# Patient Record
Sex: Female | Born: 1935 | Race: White | Hispanic: No | Smoking: Former smoker
Health system: Southern US, Community
[De-identification: ages and names within clinical notes are randomized; demographics above are authoritative.]

## PROBLEM LIST (undated history)

## (undated) DIAGNOSIS — F0390 Unspecified dementia without behavioral disturbance: Secondary | ICD-10-CM

## (undated) DIAGNOSIS — I10 Essential (primary) hypertension: Secondary | ICD-10-CM

## (undated) DIAGNOSIS — K219 Gastro-esophageal reflux disease without esophagitis: Secondary | ICD-10-CM

## (undated) HISTORY — PX: TOTAL HIP ARTHROPLASTY: SHX124

---

## 1998-09-09 ENCOUNTER — Ambulatory Visit (HOSPITAL_COMMUNITY): Admission: RE | Admit: 1998-09-09 | Discharge: 1998-09-09 | Payer: Self-pay | Admitting: Neurosurgery

## 1998-09-09 ENCOUNTER — Encounter: Payer: Self-pay | Admitting: Neurosurgery

## 1999-09-06 ENCOUNTER — Ambulatory Visit (HOSPITAL_COMMUNITY): Admission: RE | Admit: 1999-09-06 | Discharge: 1999-09-06 | Payer: Self-pay | Admitting: Neurosurgery

## 1999-09-06 ENCOUNTER — Encounter: Payer: Self-pay | Admitting: Neurosurgery

## 2006-07-15 ENCOUNTER — Ambulatory Visit: Payer: Self-pay | Admitting: Otolaryngology

## 2007-04-01 ENCOUNTER — Ambulatory Visit: Payer: Self-pay | Admitting: Gastroenterology

## 2007-05-30 ENCOUNTER — Ambulatory Visit: Payer: Self-pay | Admitting: Gastroenterology

## 2007-07-01 ENCOUNTER — Ambulatory Visit: Payer: Self-pay | Admitting: Internal Medicine

## 2008-12-30 ENCOUNTER — Ambulatory Visit: Payer: Self-pay | Admitting: Internal Medicine

## 2010-02-20 ENCOUNTER — Ambulatory Visit: Payer: Self-pay | Admitting: Internal Medicine

## 2012-01-29 ENCOUNTER — Ambulatory Visit: Payer: Self-pay | Admitting: Internal Medicine

## 2012-09-25 ENCOUNTER — Ambulatory Visit: Payer: Self-pay | Admitting: Family

## 2013-03-23 ENCOUNTER — Ambulatory Visit: Payer: Self-pay | Admitting: Internal Medicine

## 2014-03-19 ENCOUNTER — Ambulatory Visit: Payer: Self-pay | Admitting: Internal Medicine

## 2014-08-24 ENCOUNTER — Ambulatory Visit: Payer: Self-pay | Admitting: Internal Medicine

## 2014-11-30 ENCOUNTER — Ambulatory Visit: Payer: Self-pay | Admitting: Internal Medicine

## 2015-01-13 IMAGING — RF DG BARIUM SWALLOW (MODIFIED)
1 series · 1 of 1 positions shown · non-contrast
Comparison: Barium swallow examination August 24, 2014

ADDENDUM:
Please note that most of the fluoroscopic images were not
successfully saved and PACs. These should be available on the
videotape.
CLINICAL DATA: Suspected silent laryngeal penetration of the barium
on a standard barium swallow examination August 24, 2014.

EXAM:
MODIFIED BARIUM SWALLOW
TECHNIQUE: Different consistencies of barium were administered orally to the
patient by the Speech Pathologist. Imaging of the pharynx was
performed in the lateral projection.
FLUOROSCOPY TIME:  1 min, 36 seconds.

[Series 1: run · 1 of 1 slices shown]
[im 1/1]
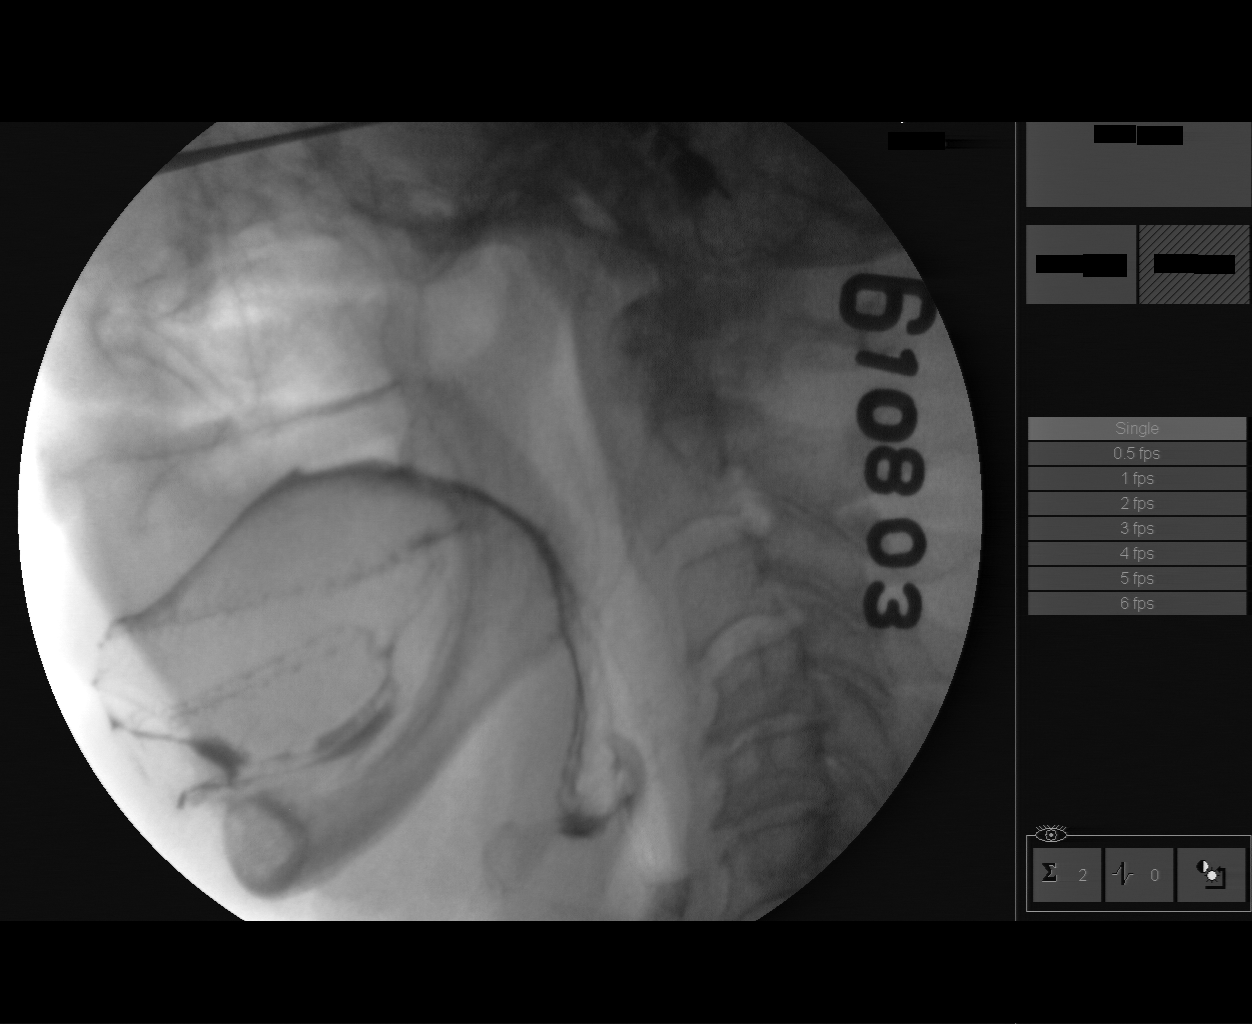

[1 of 1 positions shown; findings below may reference images not displayed]

FINDINGS: Thin liquid- within normal limits

Nectar thick liquid- within normal limits

Irwn?Dante within normal limits

Irwn?Washburn with cracker- within normal limits
IMPRESSION: No laryngeal penetration of the barium was observed on today's
study.

Please refer to the Speech Pathologists report for complete details
and recommendations.

## 2015-06-14 ENCOUNTER — Ambulatory Visit
Admission: RE | Admit: 2015-06-14 | Discharge: 2015-06-14 | Disposition: A | Discharge status: Home / Self Care | Payer: Medicare Other | Source: Ambulatory Visit | Attending: Internal Medicine | Admitting: Internal Medicine

## 2015-06-14 ENCOUNTER — Other Ambulatory Visit: Payer: Self-pay | Admitting: Internal Medicine

## 2015-06-14 DIAGNOSIS — M25472 Effusion, left ankle: Secondary | ICD-10-CM | POA: Insufficient documentation

## 2015-06-14 DIAGNOSIS — M25471 Effusion, right ankle: Secondary | ICD-10-CM | POA: Insufficient documentation

## 2015-06-14 DIAGNOSIS — X58XXXA Exposure to other specified factors, initial encounter: Secondary | ICD-10-CM | POA: Insufficient documentation

## 2015-06-14 DIAGNOSIS — S82301A Unspecified fracture of lower end of right tibia, initial encounter for closed fracture: Secondary | ICD-10-CM | POA: Diagnosis not present

## 2015-06-14 DIAGNOSIS — R609 Edema, unspecified: Secondary | ICD-10-CM

## 2015-06-18 ENCOUNTER — Ambulatory Visit
Admission: RE | Admit: 2015-06-18 | Discharge: 2015-06-18 | Disposition: A | Discharge status: Home / Self Care | Payer: Medicare Other | Source: Ambulatory Visit | Attending: Family Medicine | Admitting: Family Medicine

## 2015-06-18 ENCOUNTER — Other Ambulatory Visit: Payer: Self-pay | Admitting: Family Medicine

## 2015-06-18 ENCOUNTER — Other Ambulatory Visit
Admission: RE | Admit: 2015-06-18 | Discharge: 2015-06-18 | Disposition: A | Discharge status: Home / Self Care | Payer: Medicare Other | Source: Ambulatory Visit | Attending: *Deleted | Admitting: *Deleted

## 2015-06-18 DIAGNOSIS — M79605 Pain in left leg: Secondary | ICD-10-CM

## 2015-06-18 DIAGNOSIS — M25472 Effusion, left ankle: Secondary | ICD-10-CM

## 2015-06-18 DIAGNOSIS — R600 Localized edema: Secondary | ICD-10-CM | POA: Diagnosis present

## 2015-06-18 LAB — FIBRIN DERIVATIVES D-DIMER (ARMC ONLY): Fibrin derivatives D-dimer (ARMC): 1727 — ABNORMAL HIGH (ref 0–499)

## 2015-07-03 IMAGING — US US EXTREM LOW VENOUS*L*
1 series · 13 of 24 positions shown · non-contrast
Comparison: None.

CLINICAL DATA: [AGE] with 2 week history of left lower
extremity pain and left ankle edema.



[Series 1: us extrem low venous*left* · 0.05mm/px · 13 of 39 slices shown]
[im 1/39]
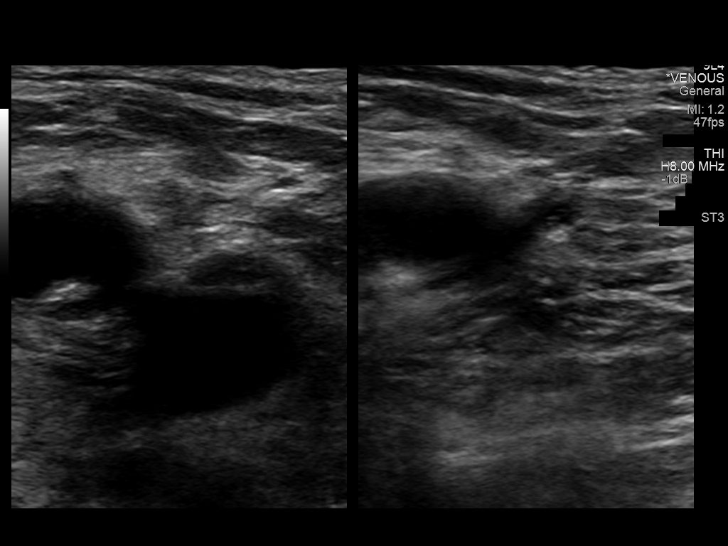
[im 4/39]
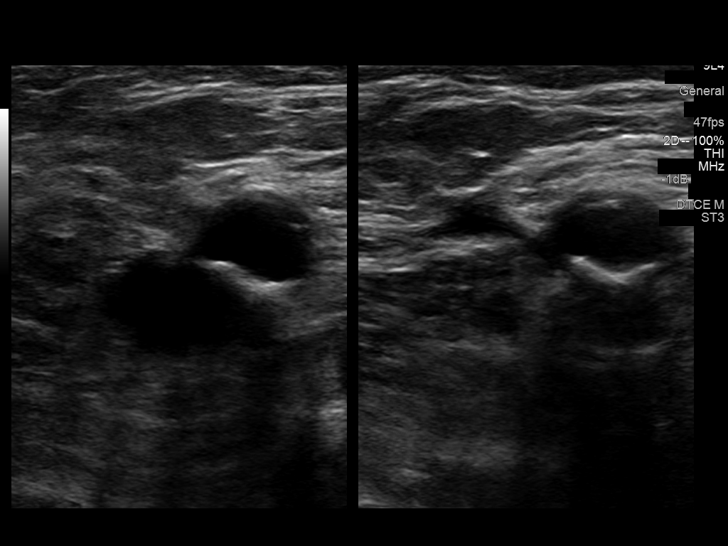
[im 7/39]
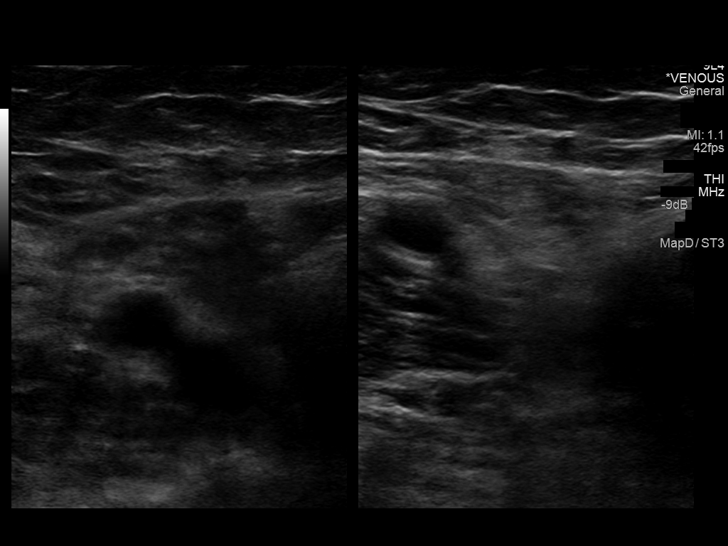
[im 10/39]
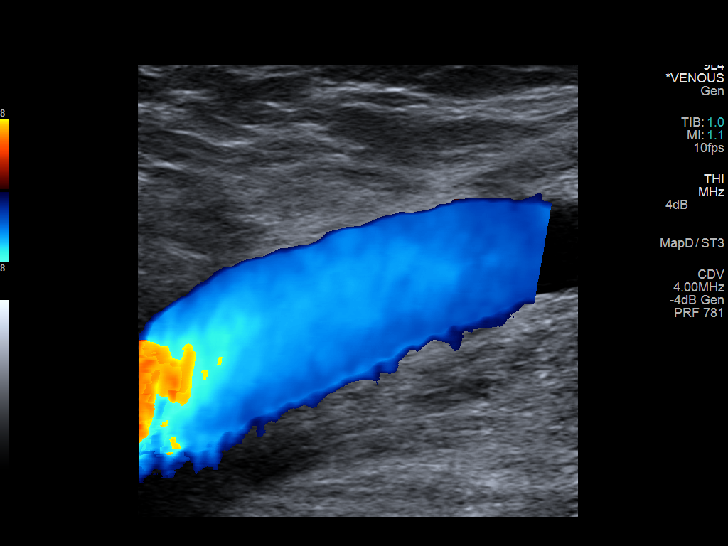
[im 14/39]
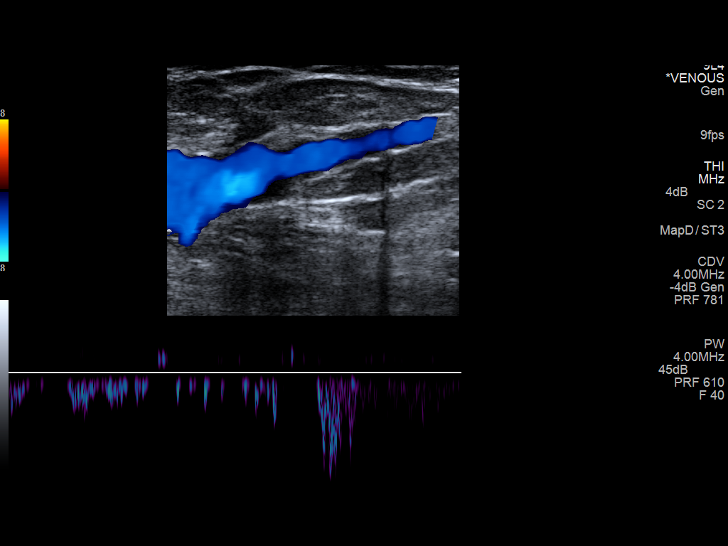
[im 17/39]
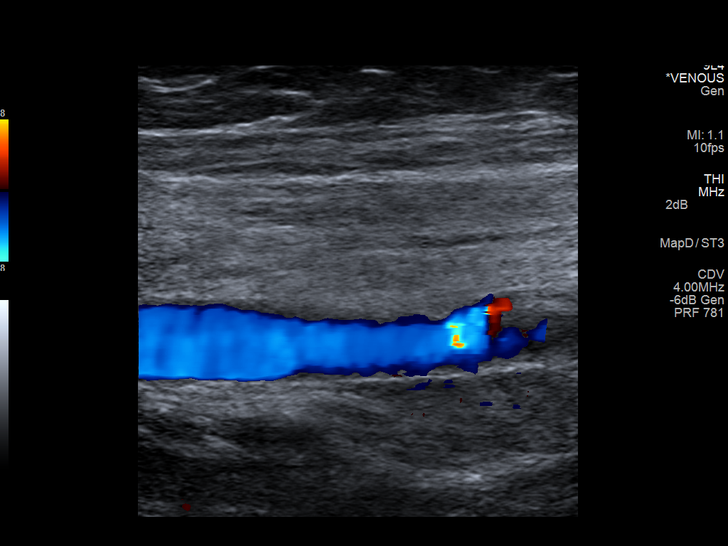
[im 20/39]
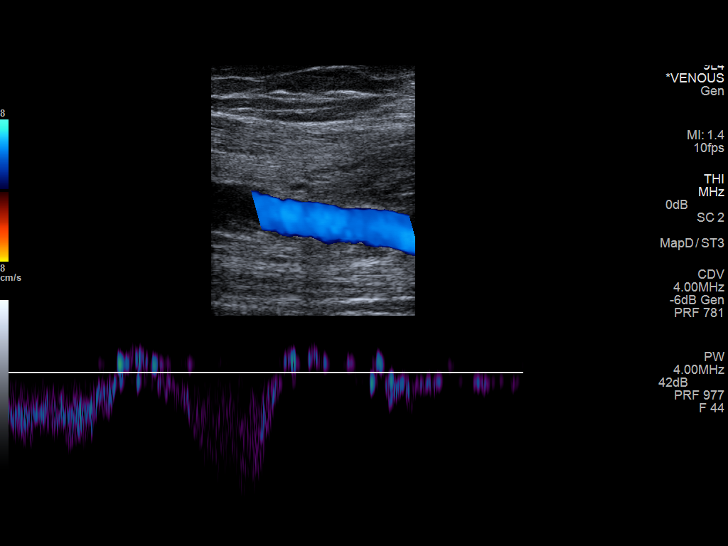
[im 22/39]
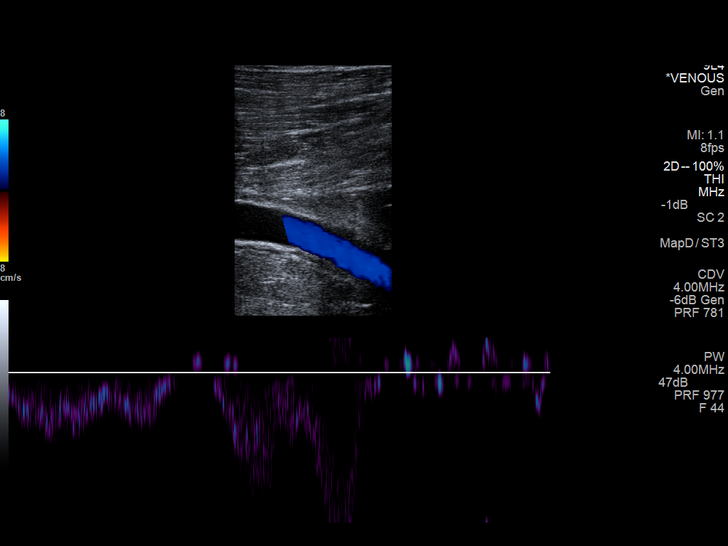
[im 25/39]
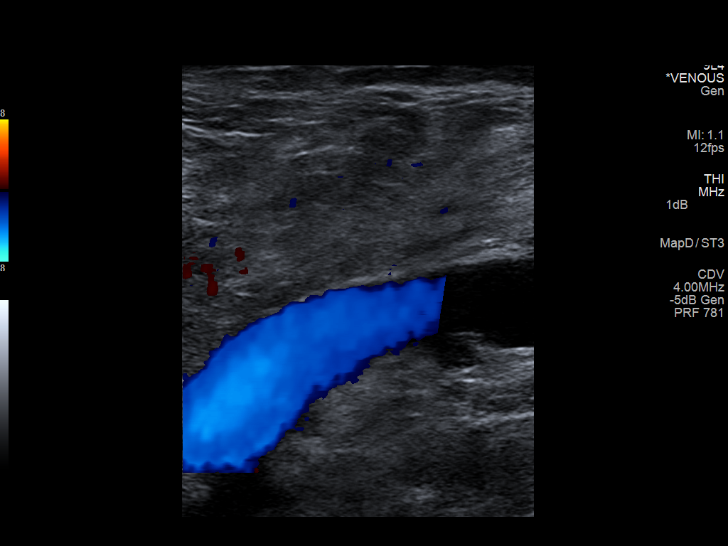
[im 29/39]
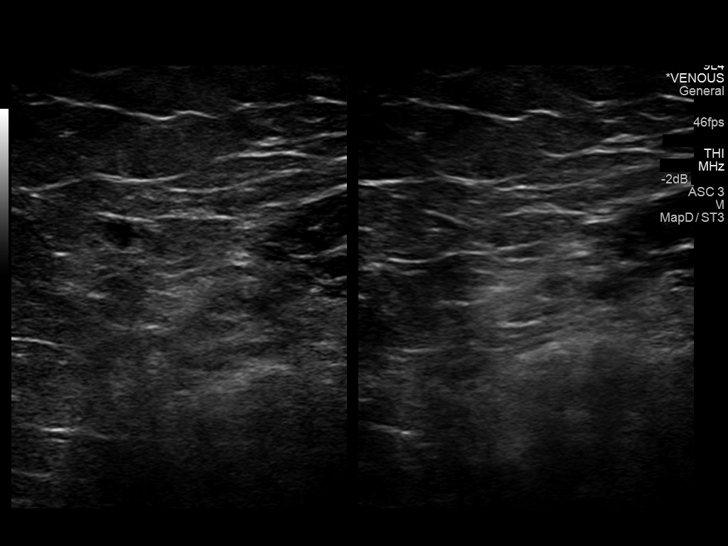
[im 32/39]
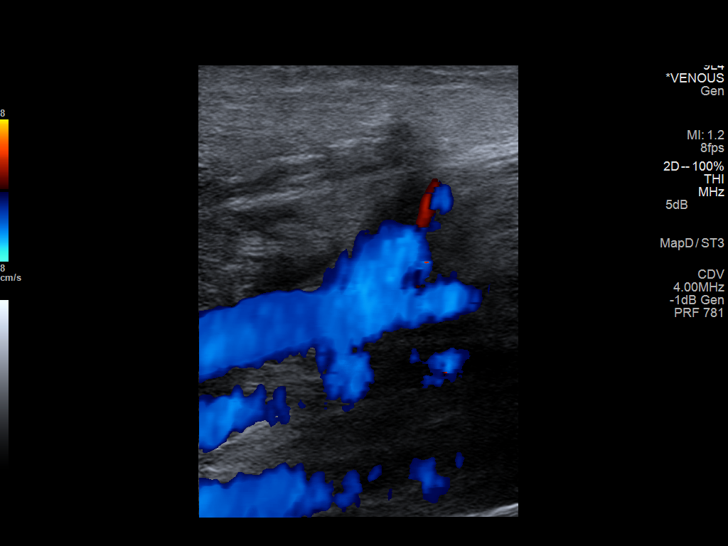
[im 35/39]
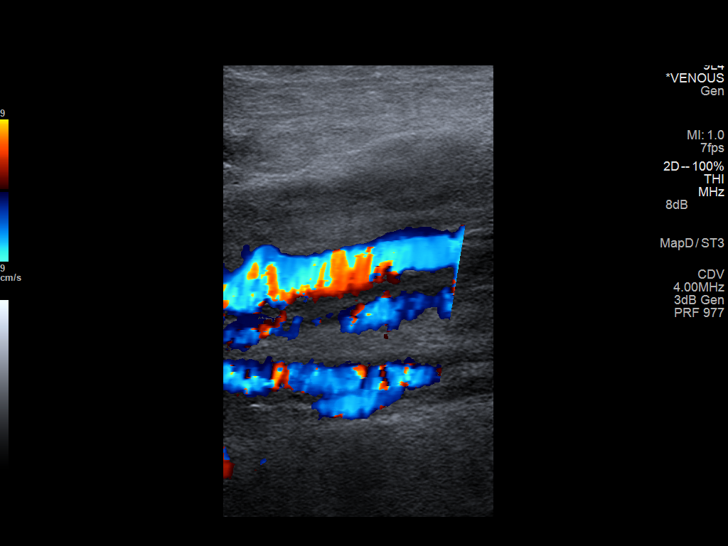
[im 39/39]
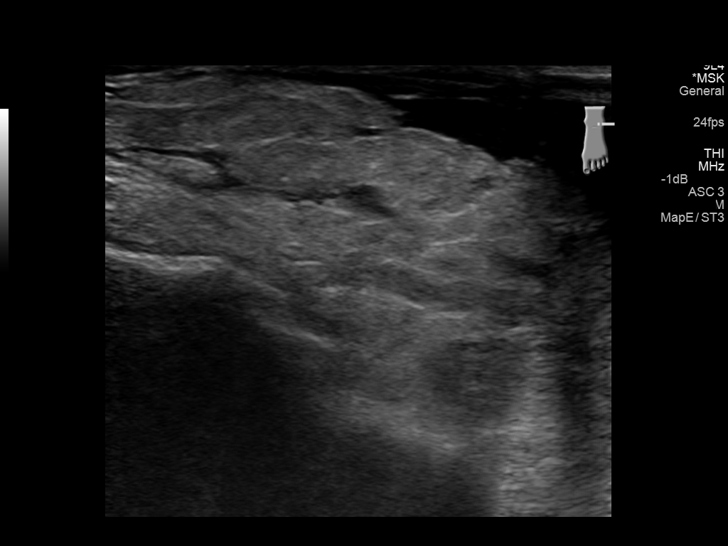

[13 of 24 positions shown; findings below may reference images not displayed]

FINDINGS: Contralateral Common Femoral Vein: Respiratory phasicity is normal
and symmetric with the symptomatic side. No evidence of thrombus.
Normal compressibility.

Common Femoral Vein: No evidence of thrombus. Normal
compressibility, respiratory phasicity and response to augmentation.

Saphenofemoral Junction: No evidence of thrombus. Normal
compressibility and flow on color Doppler imaging.

Profunda Femoral Vein: No evidence of thrombus. Normal
compressibility and flow on color Doppler imaging.

Femoral Vein: No evidence of thrombus. Normal compressibility,
respiratory phasicity and response to augmentation.

Popliteal Vein: No evidence of thrombus. Normal compressibility,
respiratory phasicity and response to augmentation.

Calf Veins: No evidence of thrombus. Normal compressibility and flow
on color Doppler imaging.

Superficial Great Saphenous Vein: No evidence of thrombus. Normal
compressibility and flow on color Doppler imaging.

Venous Reflux:  Not evaluated.

Other Findings:  Subcutaneous edema at the level of the left ankle.
IMPRESSION: No evidence of left lower extremity DVT.

## 2015-07-15 ENCOUNTER — Ambulatory Visit
Admission: RE | Admit: 2015-07-15 | Discharge: 2015-07-15 | Disposition: A | Discharge status: Home / Self Care | Payer: Medicare Other | Source: Ambulatory Visit | Attending: Internal Medicine | Admitting: Internal Medicine

## 2015-07-15 ENCOUNTER — Ambulatory Visit
Admission: RE | Admit: 2015-07-15 | Discharge: 2015-07-15 | Disposition: A | Discharge status: Home / Self Care | Payer: Medicare Other | Source: Ambulatory Visit | Attending: Cardiology | Admitting: Cardiology

## 2015-07-15 ENCOUNTER — Other Ambulatory Visit: Payer: Self-pay | Admitting: Internal Medicine

## 2015-07-15 DIAGNOSIS — R05 Cough: Secondary | ICD-10-CM

## 2015-07-15 DIAGNOSIS — R053 Chronic cough: Secondary | ICD-10-CM

## 2016-07-29 ENCOUNTER — Inpatient Hospital Stay
Admission: EM | Admit: 2016-07-29 | Discharge: 2016-08-17 | DRG: 378 | Disposition: E | Payer: Medicare Other | Attending: Internal Medicine | Admitting: Internal Medicine

## 2016-07-29 DIAGNOSIS — Z87891 Personal history of nicotine dependence: Secondary | ICD-10-CM | POA: Diagnosis not present

## 2016-07-29 DIAGNOSIS — I2510 Atherosclerotic heart disease of native coronary artery without angina pectoris: Secondary | ICD-10-CM | POA: Diagnosis present

## 2016-07-29 DIAGNOSIS — K219 Gastro-esophageal reflux disease without esophagitis: Secondary | ICD-10-CM | POA: Diagnosis present

## 2016-07-29 DIAGNOSIS — Z96649 Presence of unspecified artificial hip joint: Secondary | ICD-10-CM | POA: Diagnosis present

## 2016-07-29 DIAGNOSIS — D649 Anemia, unspecified: Secondary | ICD-10-CM | POA: Diagnosis present

## 2016-07-29 DIAGNOSIS — Z66 Do not resuscitate: Secondary | ICD-10-CM | POA: Diagnosis present

## 2016-07-29 DIAGNOSIS — Z7982 Long term (current) use of aspirin: Secondary | ICD-10-CM | POA: Diagnosis not present

## 2016-07-29 DIAGNOSIS — K922 Gastrointestinal hemorrhage, unspecified: Secondary | ICD-10-CM | POA: Diagnosis present

## 2016-07-29 DIAGNOSIS — N179 Acute kidney failure, unspecified: Secondary | ICD-10-CM | POA: Diagnosis present

## 2016-07-29 DIAGNOSIS — I10 Essential (primary) hypertension: Secondary | ICD-10-CM | POA: Diagnosis present

## 2016-07-29 DIAGNOSIS — F0280 Dementia in other diseases classified elsewhere without behavioral disturbance: Secondary | ICD-10-CM | POA: Diagnosis present

## 2016-07-29 DIAGNOSIS — K921 Melena: Secondary | ICD-10-CM | POA: Diagnosis present

## 2016-07-29 DIAGNOSIS — Z7902 Long term (current) use of antithrombotics/antiplatelets: Secondary | ICD-10-CM

## 2016-07-29 HISTORY — DX: Unspecified dementia, unspecified severity, without behavioral disturbance, psychotic disturbance, mood disturbance, and anxiety: F03.90

## 2016-07-29 HISTORY — DX: Gastro-esophageal reflux disease without esophagitis: K21.9

## 2016-07-29 HISTORY — DX: Essential (primary) hypertension: I10

## 2016-07-29 LAB — CBC
HEMATOCRIT: 20.2 % — AB (ref 35.0–47.0)
Hemoglobin: 6.5 g/dL — ABNORMAL LOW (ref 12.0–16.0)
MCH: 23.9 pg — AB (ref 26.0–34.0)
MCHC: 32.3 g/dL (ref 32.0–36.0)
MCV: 74.1 fL — ABNORMAL LOW (ref 80.0–100.0)
Platelets: 281 10*3/uL (ref 150–440)
RBC: 2.72 MIL/uL — ABNORMAL LOW (ref 3.80–5.20)
RDW: 16.6 % — AB (ref 11.5–14.5)
WBC: 7.4 10*3/uL (ref 3.6–11.0)

## 2016-07-29 LAB — COMPREHENSIVE METABOLIC PANEL
ALBUMIN: 3.4 g/dL — AB (ref 3.5–5.0)
ALT: 9 U/L — ABNORMAL LOW (ref 14–54)
ANION GAP: 8 (ref 5–15)
AST: 20 U/L (ref 15–41)
Alkaline Phosphatase: 77 U/L (ref 38–126)
BILIRUBIN TOTAL: 0.4 mg/dL (ref 0.3–1.2)
BUN: 29 mg/dL — ABNORMAL HIGH (ref 6–20)
CO2: 24 mmol/L (ref 22–32)
Calcium: 9.2 mg/dL (ref 8.9–10.3)
Chloride: 103 mmol/L (ref 101–111)
Creatinine, Ser: 1.37 mg/dL — ABNORMAL HIGH (ref 0.44–1.00)
GFR, EST AFRICAN AMERICAN: 38 mL/min — AB (ref >60)
GFR, EST NON AFRICAN AMERICAN: 33 mL/min — AB (ref >60)
GLUCOSE: 197 mg/dL — AB (ref 65–99)
Potassium: 4.3 mmol/L (ref 3.5–5.1)
Sodium: 135 mmol/L (ref 135–145)
TOTAL PROTEIN: 6 g/dL — AB (ref 6.5–8.1)

## 2016-07-29 LAB — MRSA PCR SCREENING: MRSA BY PCR: POSITIVE — AB

## 2016-07-29 LAB — PREPARE RBC (CROSSMATCH)

## 2016-07-29 LAB — HEMOGLOBIN: Hemoglobin: 8.7 g/dL — ABNORMAL LOW (ref 12.0–16.0)

## 2016-07-29 MED ORDER — SODIUM CHLORIDE 0.9 % IV SOLN
INTRAVENOUS | Status: DC
  Administered 2016-07-29: 19:00:00 via INTRAVENOUS

## 2016-07-29 MED ORDER — ONDANSETRON HCL 4 MG PO TABS: 4.0000 mg | ORAL_TABLET | Freq: Four times a day (QID) | ORAL | Status: DC | PRN

## 2016-07-29 MED ORDER — PRAVASTATIN SODIUM 20 MG PO TABS
20.0000 mg | ORAL_TABLET | ORAL | Status: DC
Start: 2016-07-30 — End: 2016-07-30

## 2016-07-29 MED ORDER — SODIUM CHLORIDE 0.9 % IV SOLN
8.0000 mg/h | INTRAVENOUS | Status: DC
  Administered 2016-07-29: 8 mg/h via INTRAVENOUS
  Filled 2016-07-29: qty 80

## 2016-07-29 MED ORDER — FLUTICASONE PROPIONATE 50 MCG/ACT NA SUSP
1.0000 | Freq: Every day | NASAL | Status: DC
  Filled 2016-07-29: qty 16

## 2016-07-29 MED ORDER — NEOMYCIN-POLYMYXIN-DEXAMETH 0.1 % OP OINT
1.0000 "application " | TOPICAL_OINTMENT | Freq: Three times a day (TID) | OPHTHALMIC | Status: DC
  Filled 2016-07-29: qty 3.5

## 2016-07-29 MED ORDER — MUPIROCIN 2 % EX OINT
1.0000 "application " | TOPICAL_OINTMENT | Freq: Two times a day (BID) | CUTANEOUS | Status: DC
  Filled 2016-07-29: qty 22

## 2016-07-29 MED ORDER — OCUVITE-LUTEIN PO CAPS: 1.0000 | ORAL_CAPSULE | Freq: Two times a day (BID) | ORAL | Status: DC

## 2016-07-29 MED ORDER — SODIUM CHLORIDE 0.9 % IV BOLUS (SEPSIS)
1000.0000 mL | Freq: Once | INTRAVENOUS | Status: AC
  Administered 2016-07-29: 1000 mL via INTRAVENOUS

## 2016-07-29 MED ORDER — ONDANSETRON HCL 4 MG/2ML IJ SOLN
4.0000 mg | Freq: Four times a day (QID) | INTRAMUSCULAR | Status: DC | PRN
  Administered 2016-07-29: 4 mg via INTRAVENOUS
  Filled 2016-07-29: qty 2

## 2016-07-29 MED ORDER — METOCLOPRAMIDE HCL 5 MG/ML IJ SOLN: 5.0000 mg | Freq: Once | INTRAMUSCULAR | Status: DC

## 2016-07-29 MED ORDER — AMITRIPTYLINE HCL 50 MG PO TABS: 25.0000 mg | ORAL_TABLET | Freq: Every day | ORAL | Status: DC

## 2016-07-29 MED ORDER — CHLORHEXIDINE GLUCONATE CLOTH 2 % EX PADS: 6.0000 | MEDICATED_PAD | Freq: Every day | CUTANEOUS | Status: DC

## 2016-07-29 MED ORDER — OLOPATADINE HCL 0.1 % OP SOLN
1.0000 [drp] | Freq: Every day | OPHTHALMIC | Status: DC | PRN
  Filled 2016-07-29: qty 5

## 2016-07-29 MED ORDER — SODIUM CHLORIDE 0.9 % IV SOLN
80.0000 mg | Freq: Once | INTRAVENOUS | Status: AC
  Administered 2016-07-29: 80 mg via INTRAVENOUS
  Filled 2016-07-29: qty 80

## 2016-07-29 MED ORDER — VITAMIN D 1000 UNITS PO TABS
1000.0000 [IU] | ORAL_TABLET | Freq: Every day | ORAL | Status: DC
  Administered 2016-07-29: 1000 [IU] via ORAL
  Filled 2016-07-29: qty 1

## 2016-07-29 MED ORDER — SODIUM CHLORIDE 0.9 % IV SOLN: 10.0000 mL/h | Freq: Once | INTRAVENOUS | Status: DC

## 2016-07-29 MED ORDER — ACETAMINOPHEN 325 MG PO TABS: 650.0000 mg | ORAL_TABLET | Freq: Four times a day (QID) | ORAL | Status: DC | PRN

## 2016-07-29 MED ORDER — ACETAMINOPHEN 650 MG RE SUPP: 650.0000 mg | Freq: Four times a day (QID) | RECTAL | Status: DC | PRN

## 2016-07-30 LAB — TYPE AND SCREEN
ABO/RH(D): O POS
ANTIBODY SCREEN: NEGATIVE
Unit division: 0

## 2016-07-31 MED FILL — Medication: Qty: 1 | Status: AC

## 2016-08-17 NOTE — Consult Note (Signed)
Consultation  Referring Provider:  Enid Baas    Primary Care Physician:  Corky Downs, MD Primary Gastroenterologist:         Reason for Consultation:       Sophia Harris  is a 82 y.o. female with a known history of Hypertension, GERD, dementia from assisted living facility was brought in secondary to weakness and lightheadedness. She has been complaining of black stools for 2 weeks. She has not mentioned it to her family or nursing home staff before today. Her hemoglobin here is noted to be 6.5. Due to her back to bleed and symptomatic anemia, she is being admitted. No baseline hemoglobin available. Stool for guaiac is strongly positive. Patient is okay for blood transfusion, but do not want any sedated procedures done. She denies any abdominal pain, diarrhea. Has had diverticular bleed in the past and prior colonoscopy several years ago was normal. She complains of nausea, no vomiting. Has had trouble with constipation in the past. She is on aspirin and Plavix. No known cardiac history, peripheral vascular disease according to family.    Impression / Plan:   GI bleed: Dark heme positive. Probable upper source: gastric or small bowel. After discussing with patient and family member she may be open to an upper endoscopy to identify source of bleed. I think this is important because at time of discharge will need to determine if can or when to restart Plavix and ASA.   For now, provide supportive care with blood products. Continue on IV Protonix. Stop ASA and Plavix.  Keep NPO for possible endoscopy.    Please contact GI Monday to readdress need of an upper endoscopy.            HPI:   Sophia Harris is a 82 y.o. female   Past Medical History:  Diagnosis Date  . Dementia   . GERD (gastroesophageal reflux disease)   . Hypertension     Past Surgical History:  Procedure Laterality Date  . TOTAL HIP ARTHROPLASTY      History reviewed. No pertinent family history.   Social  History  Substance Use Topics  . Smoking status: Former Games developer  . Smokeless tobacco: Never Used     Comment: quit > 28 years ago  . Alcohol use No    Prior to Admission medications   Medication Sig Start Date End Date Taking? Authorizing Provider  amitriptyline (ELAVIL) 25 MG tablet Take 25 mg by mouth every evening.   Yes Historical Provider, MD  aspirin 81 MG chewable tablet Chew 81 mg by mouth daily.   Yes Historical Provider, MD  cholecalciferol (VITAMIN D) 1000 units tablet Take 1,000 Units by mouth daily.   Yes Historical Provider, MD  clopidogrel (PLAVIX) 75 MG tablet Take 75 mg by mouth daily.   Yes Historical Provider, MD  labetalol (NORMODYNE) 100 MG tablet Take 50 mg by mouth 2 (two) times daily.   Yes Historical Provider, MD  mometasone (ELOCON) 0.1 % lotion Apply 4 drops topically 2 (two) times daily as needed. For itchy ears.   Yes Historical Provider, MD  mometasone (NASONEX) 50 MCG/ACT nasal spray Place 1 spray into the nose 2 (two) times daily.   Yes Historical Provider, MD  Multiple Vitamins-Minerals (PRESERVISION AREDS 2) CAPS Take 1 capsule by mouth 2 (two) times daily.   Yes Historical Provider, MD  neomycin-polymyxin-dexameth (MAXITROL) 0.1 % OINT Place 1 application into the left eye 3 (three) times daily. For 2 weeks. 07/16/16 07/30/16 Yes Historical Provider, MD  olopatadine (PATANOL) 0.1 % ophthalmic solution Place 1 drop into both eyes daily as needed for allergies.   Yes Historical Provider, MD  omeprazole (PRILOSEC) 20 MG capsule Take 20 mg by mouth daily.   Yes Historical Provider, MD  pantoprazole (PROTONIX) 40 MG tablet Take 40 mg by mouth daily.   Yes Historical Provider, MD  pravastatin (PRAVACHOL) 20 MG tablet Take 20 mg by mouth every morning.   Yes Historical Provider, MD    Current Facility-Administered Medications  Medication Dose Route Frequency Provider Last Rate Last Dose  . 0.9 %  sodium chloride infusion   Intravenous Continuous Enid Baas,  MD 50 mL/hr at 10-Aug-2016 1834    . acetaminophen (TYLENOL) tablet 650 mg  650 mg Oral Q6H PRN Enid Baas, MD       Or  . acetaminophen (TYLENOL) suppository 650 mg  650 mg Rectal Q6H PRN Enid Baas, MD      . amitriptyline (ELAVIL) tablet 25 mg  25 mg Oral QHS Enid Baas, MD      . cholecalciferol (VITAMIN D) tablet 1,000 Units  1,000 Units Oral Daily Enid Baas, MD   1,000 Units at 08/10/2016 1835  . [START ON 07/30/2016] fluticasone (FLONASE) 50 MCG/ACT nasal spray 1 spray  1 spray Each Nare Daily Enid Baas, MD      . multivitamin-lutein (OCUVITE-LUTEIN) capsule 1 capsule  1 capsule Oral BID Enid Baas, MD      . neomycin-polymyxin-dexameth (MAXITROL) 0.1 % ophth ointment 1 application  1 application Left Eye TID Enid Baas, MD      . olopatadine (PATANOL) 0.1 % ophthalmic solution 1 drop  1 drop Both Eyes Daily PRN Enid Baas, MD      . ondansetron (ZOFRAN) tablet 4 mg  4 mg Oral Q6H PRN Enid Baas, MD       Or  . ondansetron (ZOFRAN) injection 4 mg  4 mg Intravenous Q6H PRN Enid Baas, MD      . pantoprazole (PROTONIX) 80 mg in sodium chloride 0.9 % 250 mL (0.32 mg/mL) infusion  8 mg/hr Intravenous Continuous Minna Antis, MD 25 mL/hr at 2016/08/10 1835 8 mg/hr at 08-10-16 1835  . [START ON 07/30/2016] pravastatin (PRAVACHOL) tablet 20 mg  20 mg Oral Garen Lah, MD        Allergies as of August 10, 2016  . (No Known Allergies)     Review of Systems:    This is positive for those things mentioned in the HPI. All other review of systems are negative.       Physical Exam:  Vital signs in last 24 hours: Temp:  [98.3 F (36.8 C)-100.9 F (38.3 C)] 100.9 F (38.3 C) (08/13 1831) Pulse Rate:  [87-124] 124 (08/13 1831) Resp:  [16-25] 17 (08/13 1831) BP: (105-174)/(58-81) 153/75 (08/13 1831) SpO2:  [90 %-100 %] 90 % (08/13 1831) Last BM Date: 08-10-16  General:  Well-developed, well-nourished and in no  acute distress Eyes:  anicteric. ENT:   Mouth and posterior pharynx free of lesions.  Neck:   supple w/o thyromegaly or mass.  Lungs: Clear to auscultation bilaterally. Heart:  S1S2, no rubs, murmurs, gallops. Abdomen:  soft, non-tender, no hepatosplenomegaly, hernia, or mass and BS+.  Rectal: Lymph:  no cervical or supraclavicular adenopathy. Extremities:   no edema Skin   no rash. Neuro:  A&O x 3.  Psych:  appropriate mood and  Affect.   Data Reviewed:   LAB RESULTS:  Recent Labs  August 10, 2016 1252  WBC 7.4  HGB 6.5*  HCT 20.2*  PLT 281   BMET  Recent Labs  08-Jul-2016 1252  NA 135  K 4.3  CL 103  CO2 24  GLUCOSE 197*  BUN 29*  CREATININE 1.37*  CALCIUM 9.2   LFT  Recent Labs  08-Jul-2016 1252  PROT 6.0*  ALBUMIN 3.4*  AST 20  ALT 9*  ALKPHOS 77  BILITOT 0.4   PT/INR No results for input(s): LABPROT, INR in the last 72 hours.  STUDIES: No results found.   PREVIOUS ENDOSCOPIES:              Thanks   LOS: 0 days   Lacey JensenSteven Kinga Cassar MD @  March 02, 2016, 7:11 PM

## 2016-08-17 NOTE — Discharge Summary (Signed)
   [facility] [place] [facility]    Death Note     Sophia Harris [id number],MRN:[id number] is a 82 y.o. female, Outpatient Primary MD for the patient is MASOUD,JAVED, MD.  In Brief:  Sophia Lahnna Willner  is a 82 y.o. female with a known history of Hypertension, GERD, dementia from assisted living facility was brought in secondary to weakness and lightheadedness. She has been complaining of black stools for a few weeks, worsened in the last week. She has not mentioned it to her family or nursing home staff before today. Her hemoglobin here is noted to be 6.5. Due to her GI bleed and symptomatic anemia, she was admitted. No baseline hemoglobin available. Stool for guaiac is strongly positive. Patient is okay for blood transfusion, but did not want any sedated procedures done. She denies any abdominal pain, diarrhea. Has had diverticular bleed in the past and prior colonoscopy several years ago was normal. She complained of nausea, no vomiting. Has had trouble with constipation in the past. She is on aspirin and Plavix as outpatient. No known cardiac history, peripheral vascular disease according to family.  SInce the moment she came to ER, she has been complaining of wanting to die, refused any procedures. Discussed with her son and granddaughter at bedside and they respect her wishes and changed her code status to DNR.  After being admitted to the floor, patient complained of severe nausea and started getting agitated within moments had agonal breathing and passed away. May be she suffered a massive cardiac event with her anemia  Cause of Death:  #1 GI bleed #2 Anemia #3 CAD #4 Hypertension #5 Acute renal failure #6 Dementia   Pronounced dead by Dr. Nemiah CommanderKalisetti  on   December 19, 2015   @   2100                  Enid BaasKALISETTI,[name] M.D on 10-30-16 at 9:32 PM  [facility] [place] [facility]    OFFICE [phone]  Total clinical and documentation time for  today Under 30 minutes

## 2016-08-17 NOTE — H&P (Signed)
[facility] [place] [facility]   PATIENT NAME: Sophia Harris    MR#:  [id number]  DATE OF BIRTH:  10/08/1923  DATE OF ADMISSION:  Dec 31, 2015  PRIMARY CARE PHYSICIAN: Corky DownsMASOUD,JAVED, MD   REQUESTING/REFERRING PHYSICIAN: Dr. Minna AntisKevin Paduchowski  CHIEF COMPLAINT:   Chief Complaint  Patient presents with  . Melena  . Near Syncope    HISTORY OF PRESENT ILLNESS:  Sophia Lahnna Grosch  is a 82 y.o. female with a known history of Hypertension, GERD, dementia from assisted living facility was brought in secondary to weakness and lightheadedness. She has been complaining of black stools for a few weeks, worsened in the last week. She has not mentioned it to her family or nursing home staff before today. Her hemoglobin here is noted to be 6.5. Due to her back to bleed and symptomatic anemia, she is being admitted. No baseline hemoglobin available. Stool for guaiac is strongly positive. Patient is okay for blood transfusion, but do not want any sedated procedures done. She denies any abdominal pain, diarrhea. Has had diverticular bleed in the past and prior colonoscopy several years ago was normal. She complains of nausea, no vomiting. Has had trouble with constipation in the past. She is on aspirin and Plavix. No known cardiac history, peripheral vascular disease according to family.  PAST MEDICAL HISTORY:   Past Medical History:  Diagnosis Date  . Dementia   . GERD (gastroesophageal reflux disease)   . Hypertension     PAST SURGICAL HISTORY:   Past Surgical History:  Procedure Laterality Date  . TOTAL HIP ARTHROPLASTY      SOCIAL HISTORY:   Social History  Substance Use Topics  . Smoking status: Former Games developermoker  . Smokeless tobacco: Never Used     Comment: quit > 28 years ago  . Alcohol use No    FAMILY HISTORY:  No family history on file.  DRUG ALLERGIES:  No Known Allergies  REVIEW OF SYSTEMS:   Review of Systems  Constitutional: Positive for  malaise/fatigue. Negative for chills, fever and weight loss.  HENT: Positive for hearing loss. Negative for ear discharge, ear pain and nosebleeds.   Eyes: Negative for blurred vision, double vision and photophobia.  Respiratory: Negative for cough, hemoptysis, shortness of breath and wheezing.   Cardiovascular: Negative for chest pain, palpitations, orthopnea and leg swelling.  Gastrointestinal: Positive for blood in stool, constipation, melena and nausea. Negative for abdominal pain, diarrhea, heartburn and vomiting.  Genitourinary: Negative for dysuria, frequency and urgency.  Musculoskeletal: Negative for back pain, myalgias and neck pain.  Skin: Negative for rash.  Neurological: Negative for dizziness, tingling, sensory change, speech change, focal weakness and headaches.  Endo/Heme/Allergies: Does not bruise/bleed easily.  Psychiatric/Behavioral: Negative for depression.    MEDICATIONS AT HOME:   Prior to Admission medications   Medication Sig Start Date End Date Taking? Authorizing Provider  amitriptyline (ELAVIL) 25 MG tablet Take 25 mg by mouth every evening.   Yes Historical Provider, MD  aspirin 81 MG chewable tablet Chew 81 mg by mouth daily.   Yes Historical Provider, MD  cholecalciferol (VITAMIN D) 1000 units tablet Take 1,000 Units by mouth daily.   Yes Historical Provider, MD  clopidogrel (PLAVIX) 75 MG tablet Take 75 mg by mouth daily.   Yes Historical Provider, MD  labetalol (NORMODYNE) 100 MG tablet Take 50 mg by mouth 2 (two) times daily.   Yes Historical Provider, MD  mometasone (ELOCON) 0.1 % lotion Apply 4 drops topically 2 (two)  times daily as needed. For itchy ears.   Yes Historical Provider, MD  mometasone (NASONEX) 50 MCG/ACT nasal spray Place 1 spray into the nose 2 (two) times daily.   Yes Historical Provider, MD  Multiple Vitamins-Minerals (PRESERVISION AREDS 2) CAPS Take 1 capsule by mouth 2 (two) times daily.   Yes Historical Provider, MD    neomycin-polymyxin-dexameth (MAXITROL) 0.1 % OINT Place 1 application into the left eye 3 (three) times daily. For 2 weeks. 07/16/16 07/30/16 Yes Historical Provider, MD  olopatadine (PATANOL) 0.1 % ophthalmic solution Place 1 drop into both eyes daily as needed for allergies.   Yes Historical Provider, MD  omeprazole (PRILOSEC) 20 MG capsule Take 20 mg by mouth daily.   Yes Historical Provider, MD  pantoprazole (PROTONIX) 40 MG tablet Take 40 mg by mouth daily.   Yes Historical Provider, MD  pravastatin (PRAVACHOL) 20 MG tablet Take 20 mg by mouth every morning.   Yes Historical Provider, MD      VITAL SIGNS:  Blood pressure 137/76, pulse (!) 104, temperature 98.5 F (36.9 C), temperature source Oral, resp. rate 18, SpO2 98 %.  PHYSICAL EXAMINATION:   Physical Exam  GENERAL:  82 y.o.-year-old elderly patient lying in the bed with no acute distress.  EYES: Pupils equal, round, reactive to light and accommodation. No scleral icterus. Extraocular muscles intact.  HEENT: Head atraumatic, normocephalic. Oropharynx and nasopharynx clear.  NECK:  Supple, no jugular venous distention. No thyroid enlargement, no tenderness.  LUNGS: Normal breath sounds bilaterally, no wheezing, rales,rhonchi or crepitation. No use of accessory muscles of respiration. Decreased bibasilar breath sounds. CARDIOVASCULAR: S1, S2 normal. No  rubs, or gallops. 3/6 systolic murmur is present. ABDOMEN: Soft, nontender, nondistended. Bowel sounds present. No organomegaly or mass.  EXTREMITIES: No pedal edema, cyanosis, or clubbing.  NEUROLOGIC: Cranial nerves II through XII are intact. Muscle strength 5/5 in all extremities. Sensation intact. Gait not checked. Global weakness noted. PSYCHIATRIC: The patient is alert and oriented x 2-3. Short-term memory loss. SKIN: No obvious rash, lesion, or ulcer.   LABORATORY PANEL:   CBC  Recent Labs Lab 08-13-16 1252  WBC 7.4  HGB 6.5*  HCT 20.2*  PLT 281    ------------------------------------------------------------------------------------------------------------------  Chemistries   Recent Labs Lab August 13, 2016 1252  NA 135  K 4.3  CL 103  CO2 24  GLUCOSE 197*  BUN 29*  CREATININE 1.37*  CALCIUM 9.2  AST 20  ALT 9*  ALKPHOS 77  BILITOT 0.4   ------------------------------------------------------------------------------------------------------------------  Cardiac Enzymes No results for input(s): TROPONINI in the last 168 hours. ------------------------------------------------------------------------------------------------------------------  RADIOLOGY:  No results found.  EKG:   Orders placed or performed during the hospital encounter of 08-13-2016  . EKG 12-Lead  . EKG 12-Lead    IMPRESSION AND PLAN:   Lamyah Creed  is a 82 y.o. female with a known history of Hypertension, GERD, dementia from assisted living facility was brought in secondary to weakness and lightheadedness.   #1 melena/GI bleed-likely upper GI bleed. -Hold aspirin and Plavix. -Started on Protonix drip, continue that. Hemoglobin checked every 8 hours. -1 unit packed RBC transfusion has been ordered. GI consult. -Likely conservative management -Started on clear liquids for now  #2 acute renal failure-unknown baseline again. Gentle hydration and monitor renal function. -Hold nephrotoxins, could be ATN from GI bleed as well.  #3 hypertension- hold labetalol and losartan as blood pressure is low normal with GI bleed.  #4 dementia-very conversational, only short-term memory loss. No behavioral issues.  #5 DVT  prophylaxis-Ted's and SCDs  Physical therapy consult and social worker consult, to return back to assisted living facility    All the records are reviewed and case discussed with ED provider. Management plans discussed with the patient, family and they are in agreement.  CODE STATUS: DO NOT RESUSCITATE-discussed with patient and her son and  granddaughter at bedside.  TOTAL TIME TAKING CARE OF THIS PATIENT: 50 minutes.    Enid Baas M.D on 08/20/2016 at 3:56 PM  Between 7am to 6pm - Pager - [phone]  After 6pm go to [email or web address] - Social research officer, government  [facility]  Office  [phone]  CC: Primary care physician; Corky Downs, MD

## 2016-08-17 NOTE — Progress Notes (Signed)
Notified Dr Nemiah CommanderKalisetti that patient was restless and yelling out that she was "sick", daughter at bedside support given. She stated she was nauseated but not able to vomit. Orders taken for reglan

## 2016-08-17 NOTE — Progress Notes (Signed)
Went in to notify patient and daughter that reglan was ordered, pt found to have aginal breathing. Found to be a no code status, Dr Nemiah CommanderKalisetti notified and came to assess, supervisor present, daughter notified other family members, patient had passed. Support given.

## 2016-08-17 NOTE — Progress Notes (Signed)
Admitted Sophia Harris for GI bleed. Notified by RN that patient was complaining of nausea and getting agitated. She has received Zofran 2 hours prior to this on. Low-dose Reglan was ordered to be given. However went on and went to check on the patient prior to giving any medication, patient was noted to have agonal breathing. Patient is a DO NOT RESUSCITATE from my discussion with the patient and her family at bedside during admission. Went to see the patient. No breath sounds, no cardiac activity on auscultation. Pupils are fixed and dilated. Pronounced dead at 9:00 PM. One of her daughters was at bedside.

## 2016-08-17 NOTE — ED Triage Notes (Signed)
[facility]Staff reported to EMS that pt. Was at church at the facility that she lives in when she collapsed. Staff reports that she was unconscious for about 1 min but denies that she hit her head. Pt. Reports that she has been having black tarry stools for a few weeks. Pt. Reports near syncopal episodes for 2-3 days. Pt. Denies chest pain but does report trouble breathing.

## 2016-08-17 NOTE — ED Provider Notes (Addendum)
[facility] Emergency Department Provider Note  Time seen: 1:25 PM  I have reviewed the triage vital signs and the nursing notes.   HISTORY  Chief Complaint Melena and Near Syncope    HPI Sophia Hackernna Belle Gombos is a 82 y.o. female who presents to the emergency department for near syncope and generalized weakness. According to the patient (history somewhat limited by dementia)she has been having loose stool for the past 3 or 4 days, very dark in color. Today she has felt very weak and lightheaded like she is going to pass out. Denies any abdominal pain, nausea, vomiting or chest pain. Patient does not believe she takes blood thinners but is not sure. [facility]. Denies any complaints while lying in bed.  Past Medical History:  Diagnosis Date  . Dementia     There are no active problems to display for this patient.   No past surgical history on file.  Prior to Admission medications   Not on File    No Known Allergies  No family history on file.  Social History Social History  Substance Use Topics  . Smoking status: Not on file  . Smokeless tobacco: Not on file  . Alcohol use Not on file    Review of Systems Constitutional: Negative for fever. Eyes: Negative for visual changes. ENT: Negative for congestion Cardiovascular: Negative for chest pain. Respiratory: Negative for shortness of breath. Gastrointestinal: Negative for abdominal pain, vomiting and diarrhea. Genitourinary: Negative for dysuria. Musculoskeletal: Negative for back pain. Skin: Negative for rash. Neurological: Negative for headaches, focal weakness or numbness. 10-point ROS otherwise negative.  ____________________________________________   PHYSICAL EXAM:  VITAL SIGNS: ED Triage Vitals  Enc Vitals Group     BP 12/19/15 1246 (!) 128/58     Pulse Rate 12/19/15 1246 89     Resp --      Temp 12/19/15 1246 98.5 F (36.9 C)     Temp  Source 12/19/15 1246 Oral     SpO2 12/19/15 1237 93 %     Weight --      Height --      Head Circumference --      Peak Flow --      Pain Score 12/19/15 1247 0     Pain Loc --      Pain Edu? --      Excl. in GC? --     Constitutional: Alert and oriented. Well appearing and in no distress. Eyes: Normal exam ENT   Head: Normocephalic and atraumatic.   Mouth/Throat: Mucous membranes are moist. Cardiovascular: Normal rate, regular rhythm. No murmur Respiratory: Normal respiratory effort without tachypnea nor retractions. Breath sounds are clear Gastrointestinal: Soft and nontender. No distention.   Musculoskeletal: Nontender with normal range of motion in all extremities.  Neurologic:  Normal speech and language. No gross focal neurologic deficits  Skin:  Skin is warm, dry and intact.  Psychiatric: Mood and affect are normal. Speech and behavior are normal.   ____________________________________________   INITIAL IMPRESSION / ASSESSMENT AND PLAN / ED COURSE  Pertinent labs & imaging results that were available during my care of the patient were reviewed by me and considered in my medical decision making (see chart for details).  The patient presents emergency department loose stool, dark in color with near syncope/lightheadedness today. Patient has a nontender abdominal exam, appears well however on rectal exam shows dark stool strongly guaiac positive. We will check labs and closely monitor  in the emergency department. We'll IV hydrate while awaiting lab results. Patient will require admission to the hospital given the degree of melena, with near syncopal symptoms.  Patient's labs are resulted showing a bloodless 6.5. Patient receiving IV Protonix. I have consented the patient for blood products as well as the power of attorney. We will admit to the hospital for further workup. Family states they do not want heroic efforts but are willing to undergo blood  transfusion.   CRITICAL CARE Performed by: Minna Antis   Total critical care time: 30 minutes  Critical care time was exclusive of separately billable procedures and treating other patients.  Critical care was necessary to treat or prevent imminent or life-threatening deterioration.  Critical care was time spent personally by me on the following activities: development of treatment plan with patient and/or surrogate as well as nursing, discussions with consultants, evaluation of patient's response to treatment, examination of patient, obtaining history from patient or surrogate, ordering and performing treatments and interventions, ordering and review of laboratory studies, ordering and review of radiographic studies, pulse oximetry and re-evaluation of patient's condition.    EKG reviewed and interpreted by myself shows normal sinus rhythm at 91 bpm, narrow QRS, normal axis, nonspecific ST changes. No concerning ST elevation.   ____________________________________________   FINAL CLINICAL IMPRESSION(S) / ED DIAGNOSES  GI bleed Symptomatic anemia   Minna Antis, MD 11-Aug-2016 1436    Minna Antis, MD 08/09/16 [phone]

## 2016-08-17 DEATH — deceased
